# Patient Record
Sex: Male | Born: 1965 | Race: White | Hispanic: No | Marital: Married | Smoking: Current every day smoker
Health system: Southern US, Community
[De-identification: ages and names within clinical notes are randomized; demographics above are authoritative.]

## PROBLEM LIST (undated history)

## (undated) DIAGNOSIS — M549 Dorsalgia, unspecified: Secondary | ICD-10-CM

## (undated) HISTORY — DX: Dorsalgia, unspecified: M54.9

## (undated) HISTORY — PX: OTHER SURGICAL HISTORY: SHX169

---

## 2004-10-21 ENCOUNTER — Emergency Department: Payer: Self-pay | Admitting: Internal Medicine

## 2004-10-21 ENCOUNTER — Other Ambulatory Visit: Payer: Self-pay

## 2009-05-30 ENCOUNTER — Emergency Department: Payer: Self-pay | Admitting: Emergency Medicine

## 2009-11-27 IMAGING — CR RIGHT ANKLE - COMPLETE 3+ VIEW
1 series · 5 of 5 positions shown · non-contrast
Comparison: none

REASON FOR EXAM: pain after falling in hole
COMMENTS:

PROCEDURE:     DXR - DXR ANKLE RIGHT COMPLETE  - May 30, 2009  [DATE]
RESULT:     Comparison: None

[Series 1: view not recorded · 0.17mm/px · 5 of 5 slices shown]
[im 1/5]
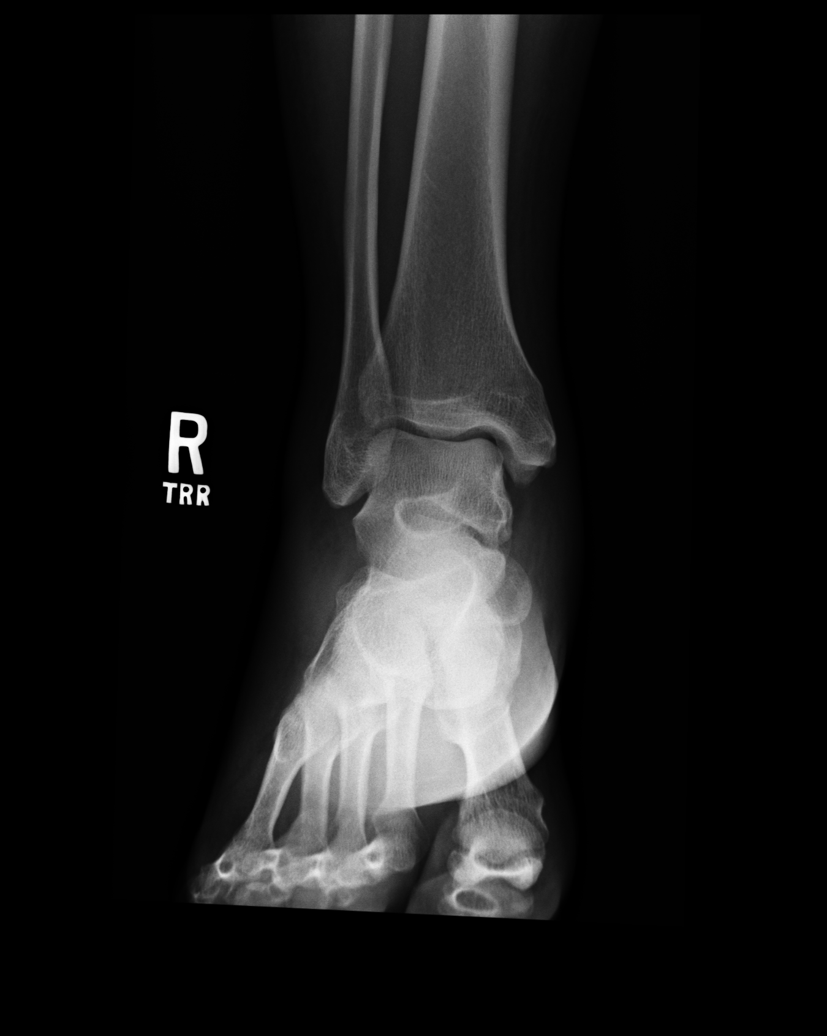
[im 2/5]
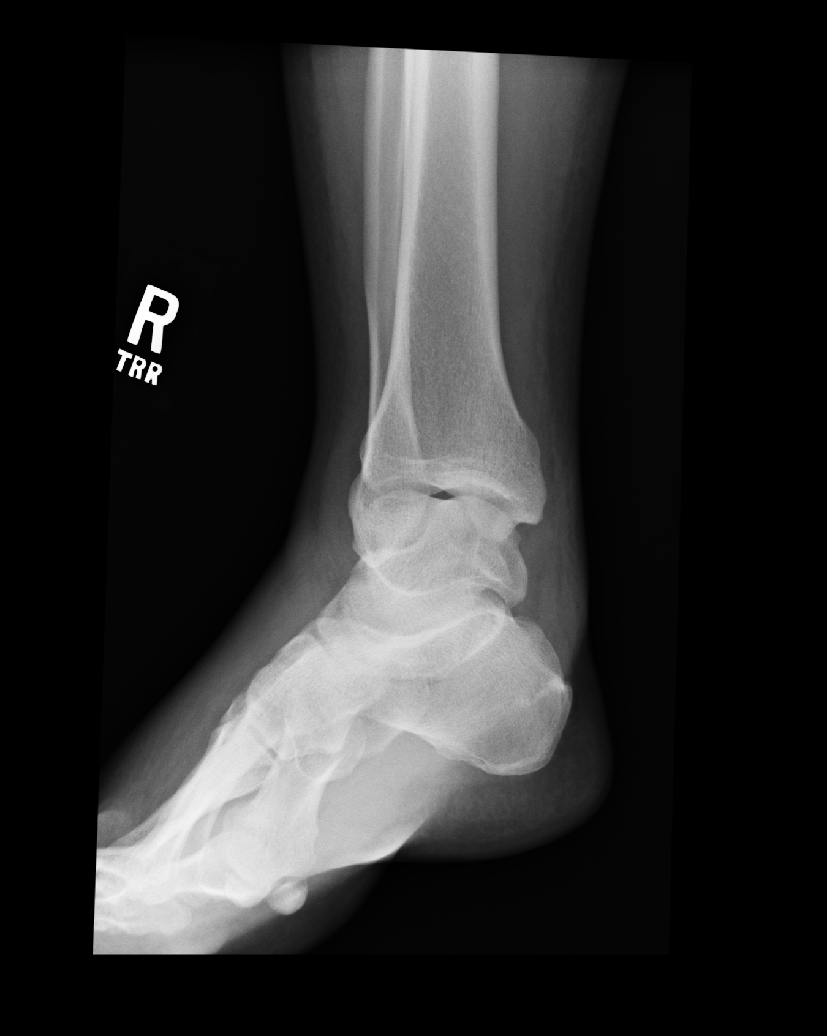
[im 3/5]
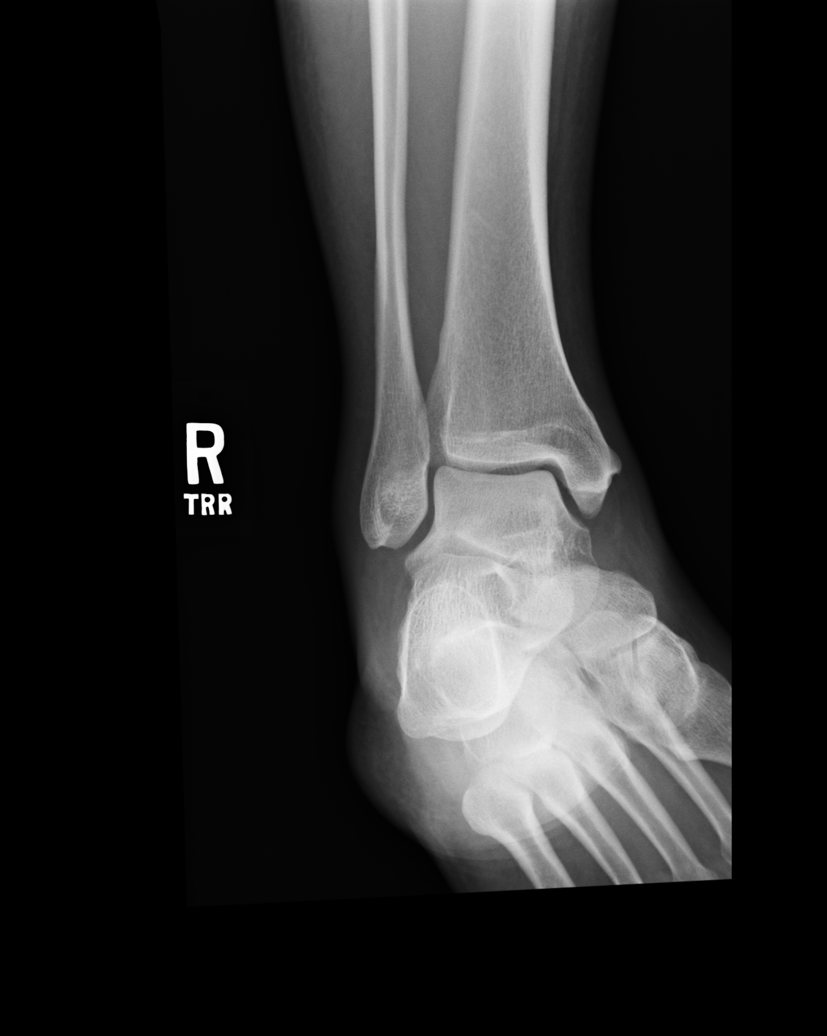
[im 4/5]
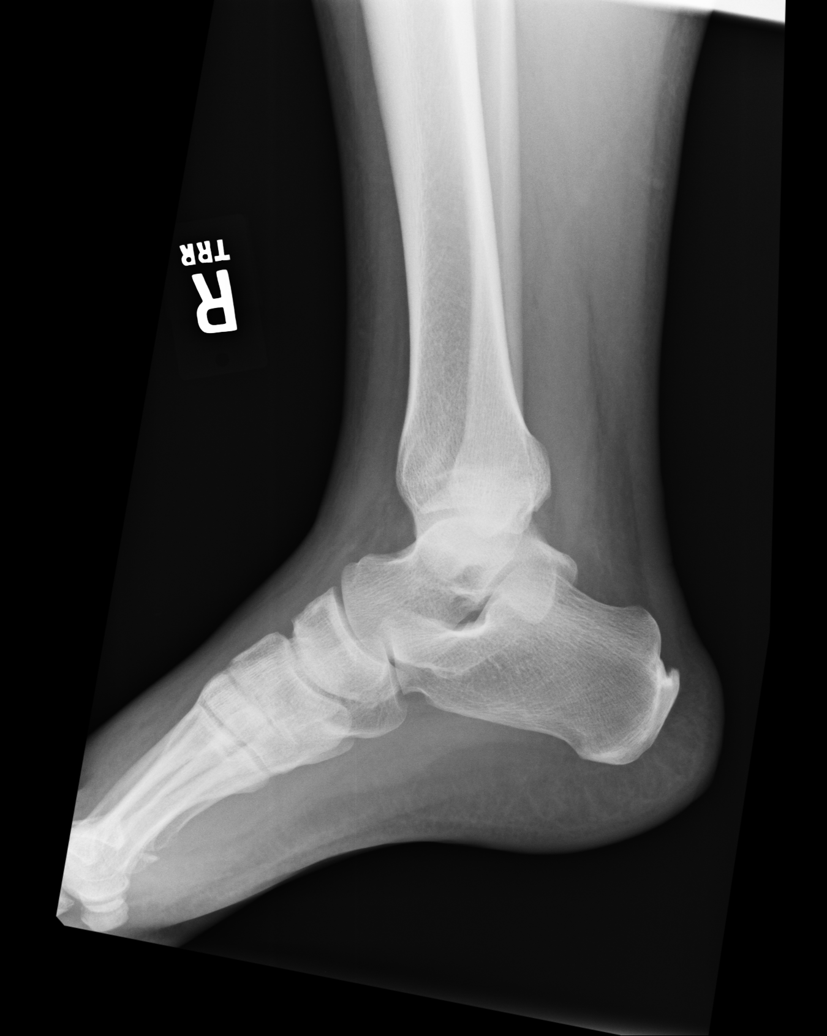
[im 5/5]
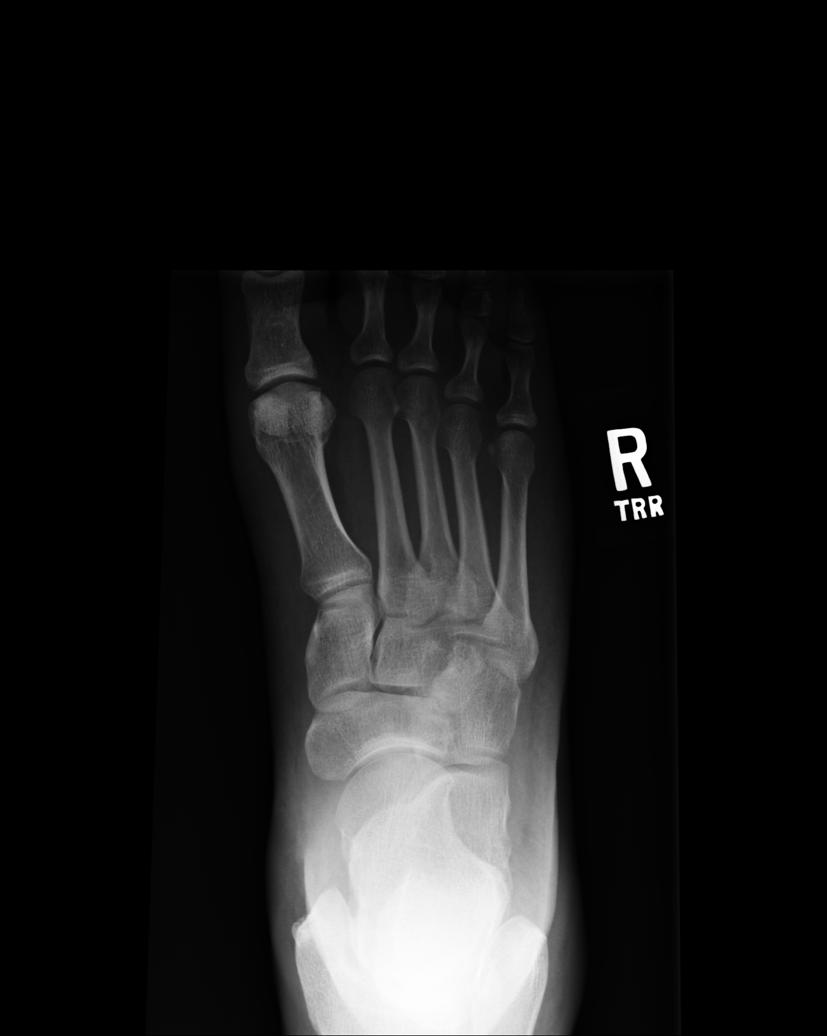

[5 of 5 positions shown; findings below may reference images not displayed]

FINDINGS: 5 views of the right ankle demonstrate no fracture or dislocation. There
ankle mortise is intact. There is no significant joint effusion. The soft
tissues are normal.
IMPRESSION: No acute osseous injury of the right ankle.

## 2016-01-18 ENCOUNTER — Emergency Department
Admission: EM | Admit: 2016-01-18 | Discharge: 2016-01-18 | Disposition: A | Discharge status: Home / Self Care | Payer: Managed Care, Other (non HMO) | Attending: Emergency Medicine | Admitting: Emergency Medicine

## 2016-01-18 DIAGNOSIS — X500XXA Overexertion from strenuous movement or load, initial encounter: Secondary | ICD-10-CM | POA: Diagnosis not present

## 2016-01-18 DIAGNOSIS — Y9389 Activity, other specified: Secondary | ICD-10-CM | POA: Diagnosis not present

## 2016-01-18 DIAGNOSIS — Y998 Other external cause status: Secondary | ICD-10-CM | POA: Diagnosis not present

## 2016-01-18 DIAGNOSIS — Y9289 Other specified places as the place of occurrence of the external cause: Secondary | ICD-10-CM | POA: Diagnosis not present

## 2016-01-18 DIAGNOSIS — M545 Low back pain: Secondary | ICD-10-CM | POA: Diagnosis present

## 2016-01-18 DIAGNOSIS — S39012A Strain of muscle, fascia and tendon of lower back, initial encounter: Secondary | ICD-10-CM | POA: Insufficient documentation

## 2016-01-18 MED ORDER — NAPROXEN 500 MG PO TABS: 500.0000 mg | ORAL_TABLET | Freq: Two times a day (BID) | ORAL | Status: DC

## 2016-01-18 MED ORDER — METHOCARBAMOL 500 MG PO TABS: 500.0000 mg | ORAL_TABLET | Freq: Four times a day (QID) | ORAL | Status: DC | PRN

## 2016-01-18 MED ORDER — ONDANSETRON HCL 4 MG/2ML IJ SOLN
4.0000 mg | Freq: Once | INTRAMUSCULAR | Status: AC
  Administered 2016-01-18: 4 mg via INTRAVENOUS
  Filled 2016-01-18: qty 2

## 2016-01-18 MED ORDER — HYDROMORPHONE HCL 1 MG/ML IJ SOLN
1.0000 mg | Freq: Once | INTRAMUSCULAR | Status: AC
  Administered 2016-01-18: 1 mg via INTRAVENOUS
  Filled 2016-01-18: qty 1

## 2016-01-18 MED ORDER — DIAZEPAM 5 MG/ML IJ SOLN
10.0000 mg | Freq: Once | INTRAMUSCULAR | Status: AC
  Administered 2016-01-18: 10 mg via INTRAVENOUS
  Filled 2016-01-18: qty 2

## 2016-01-18 MED ORDER — OXYCODONE-ACETAMINOPHEN 5-325 MG PO TABS: 1.0000 | ORAL_TABLET | ORAL | Status: DC | PRN

## 2016-01-18 NOTE — ED Notes (Signed)
Pt reports pain is back when he is up moving around. Gideon PA aware. Instructed patient to fill prescribed medications and follow up information given re: Dr. Metta Clines for pain management.

## 2016-01-18 NOTE — Discharge Instructions (Signed)
Gluteal Strain A gluteal strain happens when the muscles in the buttocks (gluteal muscles) are overstretched or torn. A tear can be partial or complete. A gluteal strain can cause pain and stiffness in your buttocks, legs, and lower back. A strain might be referred to as "pulling a muscle." The severity of a gluteal strain is rated in degrees. First-degree strains have the least amount of muscle tearing and pain. Second-degree and third-degrees strains have increasingly more tearing and pain.  CAUSES  There are many possible causes of gluteal strain, such as:   Stretching the muscles too far.   Putting too much stress on the muscles before they are warmed up.   Overusing the muscles.   Repetitive muscle movements over long periods of time.   Injury.  RISK FACTORS This condition is more likely to develop in:   People who are in cold weather.   People who are physically tired.  People with poor strength and flexibility.   People who do not warm up properly before physical activity.   People who do exercises or play sports with sudden bursts of activity.  People who have poor exercise technique. SYMPTOMS  Symptoms of this condition include:  Pain in the buttocks, especially when moving the legs. Pain may spread to the lower back or the legs.  Bruising and swelling of the buttocks.  Tenderness, weakness, or stiffness in the buttocks.  Muscle spasms. DIAGNOSIS  This condition is diagnosed based on a physical exam and medical history. Your health care provider may do some range of motion exercises with you. You may have tests, such as MRI or X-rays.  Your strain may be rated based on how severe it is. The ratings are:   Grade 1 strain (mild). Your muscles are overstretched. You might have very small muscle tears. This type of strain generally heals in about 1 week.   Grade 2 strain (moderate). Your muscles are partially torn. This may take 1 to 2 months to  heal.  Grade 3 strain (severe). Your muscles are completely torn. A severe strain can take more than three months to heal. Grade 3 gluteal strains are rare. TREATMENT  Treatment for this condition includes resting, icing, and raising (elevating) the injured area as much as possible. Your health care provider may recommend over-the-counter pain medicines. If your gluteal strain is severe or very painful, your health care provider may prescribe pain medicines or physical therapy. Surgery for severe strains is rare. HOME CARE INSTRUCTIONS   Take over-the-counter and prescription medicines only as told by your health care provider.  Do not drive or operate heavy machinery while taking prescription pain medicine.  Return to your normal activities as told by your health care provider. Ask your health care provider what activities are safe for you.  Rest your gluteal muscles as much as possible, especially for the first 2-3 days.  Begin exercising or stretching as told by your health care provider.  If directed, apply ice to the injured area:  Put ice in a plastic bag.  Place a towel between your skin and the bag.  Leave the ice on for 20 minutes, 2-3 times per day.  Keep all follow-up visits as told by your health care provider. This is important. PREVENTION   Warm up and stretch before physical activity.   Stretch after physical activity.   Learn and use correct techniques for exercising and playing sports.  Avoid difficult physical activities if your muscles are tired or sore.  Strengthen your gluteal muscles and the surrounding muscles.   Develop your flexibility by stretching at least once a day.  SEEK MEDICAL CARE IF:   You have pain or swelling that gets worse or does not get better with medicine.   You have "shooting" pain that moves through your leg.  SEEK IMMEDIATE MEDICAL CARE IF:  You develop weakness in part of your body.   You lose feeling in part of  your body.  You have severe pain.   You are unable to walk.  You have difficulty controlling your bladder or bowel movements.   This information is not intended to replace advice given to you by your health care provider. Make sure you discuss any questions you have with your health care provider.   Document Released: 09/05/2009 Document Revised: 07/30/2015 Document Reviewed: 03/26/2015 Elsevier Interactive Patient Education 2016 [employer]  Cryotherapy Cryotherapy is when you put ice on your injury. Ice helps lessen pain and puffiness (swelling) after an injury. Ice works the best when you start using it in the first 24 to 48 hours after an injury. HOME CARE  Put a dry or damp towel between the ice pack and your skin.  You may press gently on the ice pack.  Leave the ice on for no more than 10 to 20 minutes at a time.  Check your skin after 5 minutes to make sure your skin is okay.  Rest at least 20 minutes between ice pack uses.  Stop using ice when your skin loses feeling (numbness).  Do not use ice on someone who cannot tell you when it hurts. This includes small children and people with memory problems (dementia). GET HELP RIGHT AWAY IF:  You have white spots on your skin.  Your skin turns blue or pale.  Your skin feels waxy or hard.  Your puffiness gets worse. MAKE SURE YOU:   Understand these instructions.  Will watch your condition.  Will get help right away if you are not doing well or get worse.   This information is not intended to replace advice given to you by your health care provider. Make sure you discuss any questions you have with your health care provider.   Document Released: 04/26/2008 Document Revised: 01/31/2012 Document Reviewed: 07/01/2011 Elsevier Interactive Patient Education 2016 [employer]  Lumbosacral Strain Lumbosacral strain is a strain of any of the parts that make up your lumbosacral vertebrae. Your lumbosacral vertebrae  are the bones that make up the lower third of your backbone. Your lumbosacral vertebrae are held together by muscles and tough, fibrous tissue (ligaments).  CAUSES  A sudden blow to your back can cause lumbosacral strain. Also, anything that causes an excessive stretch of the muscles in the low back can cause this strain. This is typically seen when people exert themselves strenuously, fall, lift heavy objects, bend, or crouch repeatedly. RISK FACTORS  Physically demanding work.  Participation in pushing or pulling sports or sports that require a sudden twist of the back (tennis, golf, baseball).  Weight lifting.  Excessive lower back curvature.  Forward-tilted pelvis.  Weak back or abdominal muscles or both.  Tight hamstrings. SIGNS AND SYMPTOMS  Lumbosacral strain may cause pain in the area of your injury or pain that moves (radiates) down your leg.  DIAGNOSIS Your health care provider can often diagnose lumbosacral strain through a physical exam. In some cases, you may need tests such as X-ray exams.  TREATMENT  Treatment for your lower back injury  depends on many factors that your clinician will have to evaluate. However, most treatment will include the use of anti-inflammatory medicines. HOME CARE INSTRUCTIONS   Avoid hard physical activities (tennis, racquetball, waterskiing) if you are not in proper physical condition for it. This may aggravate or create problems.  If you have a back problem, avoid sports requiring sudden body movements. Swimming and walking are generally safer activities.  Maintain good posture.  Maintain a healthy weight.  For acute conditions, you may put ice on the injured area.  Put ice in a plastic bag.  Place a towel between your skin and the bag.  Leave the ice on for 20 minutes, 2-3 times a day.  When the low back starts healing, stretching and strengthening exercises may be recommended. SEEK MEDICAL CARE IF:  Your back pain is getting  worse.  You experience severe back pain not relieved with medicines. SEEK IMMEDIATE MEDICAL CARE IF:   You have numbness, tingling, weakness, or problems with the use of your arms or legs.  There is a change in bowel or bladder control.  You have increasing pain in any area of the body, including your belly (abdomen).  You notice shortness of breath, dizziness, or feel faint.  You feel sick to your stomach (nauseous), are throwing up (vomiting), or become sweaty.  You notice discoloration of your toes or legs, or your feet get very cold. MAKE SURE YOU:   Understand these instructions.  Will watch your condition.  Will get help right away if you are not doing well or get worse.   This information is not intended to replace advice given to you by your health care provider. Make sure you discuss any questions you have with your health care provider.   Document Released: 08/18/2005 Document Revised: 11/29/2014 Document Reviewed: 06/27/2013 Elsevier Interactive Patient Education [employer]

## 2016-01-18 NOTE — ED Notes (Signed)
Pt reports a pinched nerve that started after seeing chiropractor on Friday.  Pt's pain relieved by sitting on right hand.  Medications given IV as prescribed. Pt is resting comfortably in room. Family at bedside.

## 2016-01-18 NOTE — ED Notes (Addendum)
Pt injured back on Friday after picking up a large dog food bag. Seen Chiroprator on Friday and had is back readjusted. Pain radiated down right leg. Pt states pain is getting worse. Pt has tried Perocet, Flexeril and baclofen wit no relief.

## 2016-01-18 NOTE — ED Provider Notes (Signed)
[facility] Emergency Department Provider Note  ____________________________________________  Time seen: Approximately 12:05 PM  I have reviewed the triage vital signs and the nursing notes.   HISTORY  Chief Complaint Back Pain    HPI Gerald Quinn is a 50 y.o. male presents with complaints of severe low back pain on the right side after picking up a large dog back on Friday which was 2 days ago. Patient states that he saw a chiropractor had x-rays at his back readjusted and manipulated but still has pains coming down his right leg. Rates his pain as a 10 over 10 with no relief with anything over-the-counter. Worse having tried Percocet, Flexeril, baclofen with no relief. Has tried ice and heat nothing. denies any saddle numbness or paresthesia in the groin.   No past medical history on file.  There are no active problems to display for this patient.   No past surgical history on file.  Current Outpatient Rx  Name  Route  Sig  Dispense  Refill  . methocarbamol (ROBAXIN) 500 MG tablet   Oral   Take 1 tablet (500 mg total) by mouth every 6 (six) hours as needed for muscle spasms.   30 tablet   0   . naproxen (NAPROSYN) 500 MG tablet   Oral   Take 1 tablet (500 mg total) by mouth 2 (two) times daily with a meal.   60 tablet   0   . oxyCODONE-acetaminophen (ROXICET) 5-325 MG tablet   Oral   Take 1-2 tablets by mouth every 4 (four) hours as needed for severe pain.   15 tablet   0     Allergies Review of patient's allergies indicates no known allergies.  No family history on file.  Social History Social History  Substance Use Topics  . Smoking status: Not on file  . Smokeless tobacco: Not on file  . Alcohol Use: Not on file    Review of Systems Constitutional: No fever/chills Eyes: No visual changes. ENT: No sore throat. Cardiovascular: Denies chest pain. Respiratory: Denies shortness of breath. Gastrointestinal: No abdominal pain.   No nausea, no vomiting.  No diarrhea.  No constipation. Genitourinary: Negative for dysuria. Musculoskeletal: Positive for severe low back pain. Skin: Negative for rash. Neurological: Negative for headaches, focal weakness or numbness.  10-point ROS otherwise negative.  ____________________________________________   PHYSICAL EXAM:  VITAL SIGNS: ED Triage Vitals  Enc Vitals Group     BP 01/18/16 1112 137/85 mmHg     Pulse Rate 01/18/16 1112 83     Resp 01/18/16 1112 18     Temp 01/18/16 1112 98 F (36.7 C)     Temp Source 01/18/16 1112 Oral     SpO2 01/18/16 1112 98 %     Weight 01/18/16 1112 202 lb (91.627 kg)     Height 01/18/16 1112  (1.676 m)     Head Cir --      Peak Flow --      Pain Score 01/18/16 1115 10     Pain Loc --      Pain Edu? --      Excl. in GC? --     Constitutional: Alert and oriented. Well appearing and in no acute distress. Cardiovascular: Normal rate, regular rhythm. Grossly normal heart sounds.  Good peripheral circulation. Respiratory: Normal respiratory effort.  No retractions. Lungs CTAB. Gastrointestinal: Soft and nontender. No distention. No abdominal bruits. No CVA tenderness. Musculoskeletal: No lower extremity tenderness nor edema.  Straight  leg raise positive bilaterally at 5. Point tenderness noted at the right lumbar paraspinal area. Neurologic:  Normal speech and language. No gross focal neurologic deficits are appreciated. No gait instability. Distally neurovascularly intact. Skin:  Skin is warm, dry and intact. No rash noted. Psychiatric: Mood and affect are normal. Speech and behavior are normal.  ____________________________________________   LABS (all labs ordered are listed, but only abnormal results are displayed)  Labs Reviewed - No data to display ____________________________________________   PROCEDURES  Procedure(s) performed: None  Critical Care performed:  No  ____________________________________________   INITIAL IMPRESSION / ASSESSMENT AND PLAN / ED COURSE  Pertinent labs & imaging results that were available during my care of the patient were reviewed by me and considered in my medical decision making (see chart for details).  Patient reports much improvement after Dilaudid and Valium and Zofran pain is now off for over 10. Discharged patient to follow up with pain management doctor crisp. Rx given for Flexeril 10 mg 3 times a day, Naprosyn 500 mg twice a day and Percocet 5/325 #8. Patient voices no other emergency medical complaints at this time ____________________________________________   FINAL CLINICAL IMPRESSION(S) / ED DIAGNOSES  Final diagnoses:  Lumbar strain, initial encounter     This chart was dictated using voice recognition software/Dragon. Despite best efforts to proofread, errors can occur which can change the meaning. Any change was purely unintentional.   Evangeline Dakin, PA-C 01/18/16 1305  Jene Every, MD 01/18/16 [phone]

## 2016-08-16 ENCOUNTER — Ambulatory Visit (INDEPENDENT_AMBULATORY_CARE_PROVIDER_SITE_OTHER): Payer: 59 | Admitting: Internal Medicine

## 2016-08-16 ENCOUNTER — Encounter: Payer: Self-pay | Admitting: Internal Medicine

## 2016-08-16 VITALS — BP 138/90 | HR 85 | Resp 16 | Ht 66.0 in | Wt 204.0 lb

## 2016-08-16 DIAGNOSIS — Z23 Encounter for immunization: Secondary | ICD-10-CM | POA: Diagnosis not present

## 2016-08-16 DIAGNOSIS — M5416 Radiculopathy, lumbar region: Secondary | ICD-10-CM

## 2016-08-16 DIAGNOSIS — Z720 Tobacco use: Secondary | ICD-10-CM | POA: Diagnosis not present

## 2016-08-16 DIAGNOSIS — R030 Elevated blood-pressure reading, without diagnosis of hypertension: Secondary | ICD-10-CM

## 2016-08-16 DIAGNOSIS — F17200 Nicotine dependence, unspecified, uncomplicated: Secondary | ICD-10-CM

## 2016-08-16 DIAGNOSIS — IMO0001 Reserved for inherently not codable concepts without codable children: Secondary | ICD-10-CM | POA: Insufficient documentation

## 2016-08-16 DIAGNOSIS — K219 Gastro-esophageal reflux disease without esophagitis: Secondary | ICD-10-CM | POA: Insufficient documentation

## 2016-08-16 DIAGNOSIS — M5417 Radiculopathy, lumbosacral region: Secondary | ICD-10-CM | POA: Diagnosis not present

## 2016-08-16 MED ORDER — OMEPRAZOLE 20 MG PO CPDR: 20.0000 mg | DELAYED_RELEASE_CAPSULE | Freq: Every day | ORAL | 3 refills | Status: AC

## 2016-08-16 NOTE — Progress Notes (Signed)
Date:  08/16/2016   Name:  Gerald Quinn   DOB:  05/09/1966   MRN:  [id number]   Chief Complaint: Establish Care (Wants flu shot) and Back Pain (Wants ref to pain clinic for sciatic nerve paina nd wants MRI ) Back Pain  This is a recurrent problem. The current episode started more than 1 year ago. The problem occurs intermittently. The problem has been waxing and waning since onset. The pain is present in the lumbar spine. The quality of the pain is described as aching, shooting and stabbing. The pain radiates to the right foot. The pain is worse during the day. The symptoms are aggravated by bending. Stiffness is present in the morning. Associated symptoms include abdominal pain and numbness (right sided sciatica). Pertinent negatives include no bladder incontinence, bowel incontinence, chest pain, fever, headaches or weakness. (He had a prolonged episode lasting 3 weeks in March.  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 The treatment provided mild relief.  Gastroesophageal Reflux  He complains of abdominal pain, dysphagia, heartburn and water brash. He reports no chest pain, no coughing, no nausea or no wheezing. This is a recurrent problem. The problem occurs frequently. Pertinent negatives include no fatigue. Risk factors include smoking/tobacco exposure and NSAIDs. He has tried a PPI (tried his wife's old Rx - probably omeprazole) for the symptoms. The treatment provided significant relief.     Review of Systems  Constitutional: Negative for chills, fatigue and fever.  Eyes: Negative for visual disturbance.  Respiratory: Negative for cough, chest tightness, shortness of breath and wheezing.   Cardiovascular: Negative for chest pain, palpitations and leg swelling.  Gastrointestinal: Positive for abdominal pain, dysphagia and heartburn.  Negative for blood in stool, bowel incontinence and nausea.  Endocrine: Negative for polydipsia and polyuria.  Genitourinary: Negative for bladder incontinence and difficulty urinating.  Musculoskeletal: Positive for back pain. Negative for joint swelling and myalgias.  Neurological: Positive for numbness (right sided sciatica). Negative for dizziness, seizures, syncope, weakness and headaches.  Hematological: Negative for adenopathy.  Psychiatric/Behavioral: Negative for sleep disturbance.    There are no active problems to display for this patient.   Prior to Admission medications   Not on File    No Known Allergies  Past Surgical History:  Procedure Laterality Date  . none      Social History  Substance Use Topics  . Smoking status: Current Every Day Smoker    Packs/day: 1.00    Years: 25.00    Types: Cigarettes  . Smokeless tobacco: Never Used  . Alcohol use 1.2 oz/week    1 Cans of beer, 1 Shots of liquor per week     Medication list has been reviewed and updated.   Physical Exam  Constitutional: He is oriented to person, place, and time. He appears well-developed. No distress.  HENT:  Head: Normocephalic and atraumatic.  Cardiovascular: Normal rate, regular rhythm and normal heart sounds.   Pulmonary/Chest: Effort normal and breath sounds normal. No respiratory distress.  Musculoskeletal: Normal range of motion.       Right hip: Normal.       Left hip: Normal.       Right knee: Normal.       Left knee: Normal.       Lumbar back: He exhibits tenderness. He exhibits no spasm.  SLR negative on left; slightly positive on right  Neurological: He is alert and oriented to person, place, and time. He has normal strength. No sensory deficit.  Reflex Scores:      Patellar reflexes are 2+ on the right side and 2+ on the left side.      Achilles reflexes are 2+ on the right side and 2+ on the left side. Skin: Skin is warm and dry. No rash noted.  Psychiatric: He has  a normal mood and affect. His behavior is normal. Thought content normal.  Nursing note and vitals reviewed.   BP (!) 148/82   Pulse 85   Resp 16   Ht 5\' 6"  (1.676 m)   Wt 204 lb (92.5 kg)   SpO2 97%   BMI 32.93 kg/m   Assessment and Plan: 1. Lumbar back pain with radiculopathy affecting right lower extremity Ibuprofen 600 mg tid with food - Ambulatory referral to Orthopedic Surgery  2. Smoker - Nurse to provide smoking / tobacco cessation education  3. Elevated blood pressure Recommend DASH diet  4. Gastroesophageal reflux disease, esophagitis presence not specified Take PPI daily while on nsaids - omeprazole (PRILOSEC) 20 MG capsule; Take 1 capsule (20 mg total) by mouth daily.  Dispense: 30 capsule; Refill: 3  5. Need for influenza vaccination - Flu Vaccine QUAD 36+ mos PF IM (Fluarix & Fluzone Quad PF)   Bari EdwardLaura Kahlee Metivier, MD [facility]  08/16/2016

## 2016-08-16 NOTE — Patient Instructions (Addendum)
Ibuprofen 600 mg three times a day  -  DASH Eating Plan DASH stands for "Dietary Approaches to Stop Hypertension." The DASH eating plan is a healthy eating plan that has been shown to reduce high blood pressure (hypertension). Additional health benefits may include reducing the risk of type 2 diabetes mellitus, heart disease, and stroke. The DASH eating plan may also help with weight loss. WHAT DO I NEED TO KNOW ABOUT THE DASH EATING PLAN? For the DASH eating plan, you will follow these general guidelines:  Choose foods with a percent daily value for sodium of less than 5% (as listed on the food label).  Use salt-free seasonings or herbs instead of table salt or sea salt.  Check with your health care provider or pharmacist before using salt substitutes.  Eat lower-sodium products, often labeled as "lower sodium" or "no salt added."  Eat fresh foods.  Eat more vegetables, fruits, and low-fat dairy products.  Choose whole grains. Look for the word "whole" as the first word in the ingredient list.  Choose fish and skinless chicken or [place] more often than red meat. Limit fish, poultry, and meat to 6 oz (170 g) each day.  Limit sweets, desserts, sugars, and sugary drinks.  Choose heart-healthy fats.  Limit cheese to 1 oz (28 g) per day.  Eat more home-cooked food and less restaurant, buffet, and fast food.  Limit fried foods.  Cook foods using methods other than frying.  Limit canned vegetables. If you do use them, rinse them well to decrease the sodium.  When eating at a restaurant, ask that your food be prepared with less salt, or no salt if possible. WHAT FOODS CAN I EAT? Seek help from a dietitian for individual calorie needs. Grains Whole grain or whole wheat bread. Brown rice. Whole grain or whole wheat pasta. Quinoa, bulgur, and whole grain cereals. Low-sodium cereals. Corn or whole wheat flour tortillas. Whole grain cornbread. Whole grain crackers. Low-sodium  crackers. Vegetables Fresh or frozen vegetables (raw, steamed, roasted, or grilled). Low-sodium or reduced-sodium tomato and vegetable juices. Low-sodium or reduced-sodium tomato sauce and paste. Low-sodium or reduced-sodium canned vegetables.  Fruits All fresh, canned (in natural juice), or frozen fruits. Meat and Other Protein Products Ground beef (85% or leaner), grass-fed beef, or beef trimmed of fat. Skinless chicken or [place]. Ground chicken or [place]. Pork trimmed of fat. All fish and seafood. Eggs. Dried beans, peas, or lentils. Unsalted nuts and seeds. Unsalted canned beans. Dairy Low-fat dairy products, such as skim or 1% milk, 2% or reduced-fat cheeses, low-fat ricotta or cottage cheese, or plain low-fat yogurt. Low-sodium or reduced-sodium cheeses. Fats and Oils Tub margarines without trans fats. Light or reduced-fat mayonnaise and salad dressings (reduced sodium). Avocado. Safflower, olive, or canola oils. Natural peanut or almond butter. Other Unsalted popcorn and pretzels. The items listed above may not be a complete list of recommended foods or beverages. Contact your dietitian for more options. WHAT FOODS ARE NOT RECOMMENDED? Grains White bread. White pasta. White rice. Refined cornbread. Bagels and croissants. Crackers that contain trans fat. Vegetables Creamed or fried vegetables. Vegetables in a cheese sauce. Regular canned vegetables. Regular canned tomato sauce and paste. Regular tomato and vegetable juices. Fruits Dried fruits. Canned fruit in light or heavy syrup. Fruit juice. Meat and Other Protein Products Fatty cuts of meat. Ribs, chicken wings, bacon, sausage, bologna, salami, chitterlings, fatback, hot dogs, bratwurst, and packaged luncheon meats. Salted nuts and seeds. Canned beans with salt. Dairy Whole or 2% milk,  cream, half-and-half, and cream cheese. Whole-fat or sweetened yogurt. Full-fat cheeses or blue cheese. Nondairy creamers and whipped toppings.  Processed cheese, cheese spreads, or cheese curds. Condiments Onion and garlic salt, seasoned salt, table salt, and sea salt. Canned and packaged gravies. Worcestershire sauce. Tartar sauce. Barbecue sauce. Teriyaki sauce. Soy sauce, including reduced sodium. Steak sauce. Fish sauce. Oyster sauce. Cocktail sauce. Horseradish. Ketchup and mustard. Meat flavorings and tenderizers. Bouillon cubes. Hot sauce. Tabasco sauce. Marinades. Taco seasonings. Relishes. Fats and Oils Butter, stick margarine, lard, shortening, ghee, and bacon fat. Coconut, palm kernel, or palm oils. Regular salad dressings. Other Pickles and olives. Salted popcorn and pretzels. The items listed above may not be a complete list of foods and beverages to avoid. Contact your dietitian for more information. WHERE CAN I FIND MORE INFORMATION? National Heart, Lung, and Blood Institute: CableProm[employer].itwww.nhlbi.nih.gov/health/health-topics/topics/dash/   This information is not intended to replace advice given to you by your health care provider. Make sure you discuss any questions you have with your health care provider.   Document Released: 10/28/2011 Document Revised: 11/29/2014 Document Reviewed: 09/12/2013 Elsevier Interactive Patient Education [employer]2016 [employer] Inc.

## 2016-12-03 ENCOUNTER — Encounter: Payer: 59 | Admitting: Internal Medicine

## 2017-10-27 ENCOUNTER — Other Ambulatory Visit: Payer: Self-pay

## 2017-10-27 ENCOUNTER — Ambulatory Visit
Admission: EM | Admit: 2017-10-27 | Discharge: 2017-10-27 | Disposition: A | Discharge status: Home / Self Care | Payer: Managed Care, Other (non HMO) | Attending: Family Medicine | Admitting: Family Medicine

## 2017-10-27 DIAGNOSIS — S0501XA Injury of conjunctiva and corneal abrasion without foreign body, right eye, initial encounter: Secondary | ICD-10-CM

## 2017-10-27 MED ORDER — POLYMYXIN B-TRIMETHOPRIM 10000-0.1 UNIT/ML-% OP SOLN: 1.0000 [drp] | Freq: Four times a day (QID) | OPHTHALMIC | 0 refills | Status: AC

## 2017-10-27 MED ORDER — KETOROLAC TROMETHAMINE 0.5 % OP SOLN: 1.0000 [drp] | Freq: Four times a day (QID) | OPHTHALMIC | 0 refills | Status: AC | PRN

## 2017-10-27 NOTE — ED Provider Notes (Signed)
[facility]    CSN: [id number] Arrival date & time: 10/27/17  1940  History   Chief Complaint Chief Complaint  Patient presents with  . Eye Problem   HPI  51 year old male presents with the above complaint.  Patient reports that yesterday he was cutting ceramic tile.  He states it was yesterday morning.  He states that after he cut some tile he noticed that he had redness of his right eye and also developed pain/irritation.  His pain has worsened as well as his redness.  He states that he irrigated the eye today without resolution.  He feels that he has a foreign body in his right eye.  No acute vision changes.  His predominant complaint is pain.  His pain is 10 out of 10 in severity.  No known exacerbating factors.  He has had no relief.  No other complaints or concerns at this time.  Past Medical History:  Diagnosis Date  . Back pain    Patient Active Problem List   Diagnosis Date Noted  . Lumbar back pain with radiculopathy affecting right lower extremity 08/16/2016  . Smoker 08/16/2016  . Elevated blood pressure 08/16/2016  . Esophageal reflux 08/16/2016   Past Surgical History:  Procedure Laterality Date  . none     Home Medications    Prior to Admission medications   Medication Sig Start Date End Date Taking? Authorizing Provider  omeprazole (PRILOSEC) 20 MG capsule Take 1 capsule (20 mg total) by mouth daily. 08/16/16  Yes Reubin MilanBerglund, Laura H, MD  ketorolac (ACULAR) 0.5 % ophthalmic solution Place 1 drop into the right eye 4 (four) times daily as needed. 10/27/17   Tommie Samsook, Klein Willcox G, DO  trimethoprim-polymyxin b (POLYTRIM) ophthalmic solution Place 1 drop into the right eye every 6 (six) hours for 5 days. 10/27/17 11/01/17  Tommie Samsook, Mikenna Bunkley G, DO   Family History Family History  Problem Relation Age of Onset  . Coronary artery disease Mother [id number]  . Coronary artery disease Brother [id number]  . CAD Maternal Uncle   . CAD Paternal Uncle   . Coronary artery disease Maternal  Grandmother    Social History Social History   Tobacco Use  . Smoking status: Current Every Day Smoker    Packs/day: 1.00    Years: 25.00    Pack years: 25.00    Types: Cigarettes  . Smokeless tobacco: Current User  Substance Use Topics  . Alcohol use: Yes    Alcohol/week: 1.2 oz    Types: 1 Cans of beer, 1 Shots of liquor per week  . Drug use: No   Allergies   Patient has no known allergies.   Review of Systems Review of Systems  Constitutional: Negative.   Eyes: Positive for pain and redness. Negative for visual disturbance.   Physical Exam Triage Vital Signs ED Triage Vitals  Enc Vitals Group     BP 10/27/17 1952 (!) 153/90     Pulse Rate 10/27/17 1952 89     Resp 10/27/17 1952 16     Temp 10/27/17 1952 98 F (36.7 C)     Temp Source 10/27/17 1952 Oral     SpO2 10/27/17 1952 98 %     Weight 10/27/17 1953 210 lb (95.3 kg)     Height 10/27/17 1953 5\' 6"  (1.676 m)     Head Circumference --      Peak Flow --      Pain Score 10/27/17 1953 10  Pain Loc --      Pain Edu? --      Excl. in GC? --    No data found.  Updated Vital Signs BP (!) 153/90 (BP Location: Left Arm)   Pulse 89   Temp 98 F (36.7 C) (Oral)   Resp 16   Ht 5\' 6"  (1.676 m)   Wt 210 lb (95.3 kg)   SpO2 98%   BMI 33.89 kg/m   Physical Exam  Constitutional: He is oriented to person, place, and time. He appears well-developed. No distress.  HENT:  Head: Normocephalic and atraumatic.  Eyes: Pupils are equal, round, and reactive to light.  Right eye with conjunctival injection.  No foreign body appreciated.  Patient had resolution of pain with tetracaine.  Fluorescein exam revealed uptake just lateral to the iris.  Pulmonary/Chest: Effort normal. No respiratory distress.  Neurological: He is alert and oriented to person, place, and time.  Psychiatric: He has a normal mood and affect.  Vitals reviewed.  UC Treatments / Results  Labs (all labs ordered are listed, but only abnormal  results are displayed) Labs Reviewed - No data to display  EKG  EKG Interpretation None       Radiology No results found.  Procedures Procedures (including critical care time)  Medications Ordered in UC Medications - No data to display   Initial Impression / Assessment and Plan / UC Course  I have reviewed the triage vital signs and the nursing notes.  Pertinent labs & imaging results that were available during my care of the patient were reviewed by me and considered in my medical decision making (see chart for details).     51 year old male presents with a corneal abrasion.  Treating with antibiotic and Toradol.  Final Clinical Impressions(s) / UC Diagnoses   Final diagnoses:  Abrasion of right cornea, initial encounter    ED Discharge Orders        Ordered    trimethoprim-polymyxin b (POLYTRIM) ophthalmic solution  Every 6 hours     10/27/17 2022    ketorolac (ACULAR) 0.5 % ophthalmic solution  4 times daily PRN     10/27/17 2022     Controlled Substance Prescriptions [place] Controlled Substance Registry consulted? Not Applicable   Tommie SamsCook, Tressia Labrum G, DO 10/27/17 2030

## 2017-10-27 NOTE — ED Triage Notes (Signed)
Pt states he was cutting ceramic tile yesterday when he first noticed the right eye irritation. He was not wearing goggles. The eye is red and painful, and has been worsening. He believes the tile is in his eye.
